# Patient Record
Sex: Male | Born: 1945 | Race: White | Hispanic: No | Marital: Married | Smoking: Never smoker
Health system: Southern US, Community
[De-identification: ages and names within clinical notes are randomized; demographics above are authoritative.]

## PROBLEM LIST (undated history)

## (undated) HISTORY — PX: KNEE SURGERY: SHX244

## (undated) HISTORY — PX: CARDIAC VALVE REPLACEMENT: SHX585

---

## 2019-01-17 ENCOUNTER — Other Ambulatory Visit: Payer: Self-pay

## 2019-01-17 ENCOUNTER — Emergency Department (HOSPITAL_BASED_OUTPATIENT_CLINIC_OR_DEPARTMENT_OTHER)
Admission: EM | Admit: 2019-01-17 | Discharge: 2019-01-17 | Disposition: A | Discharge status: Home / Self Care | Payer: Medicare HMO | Attending: Emergency Medicine | Admitting: Emergency Medicine

## 2019-01-17 ENCOUNTER — Encounter (HOSPITAL_BASED_OUTPATIENT_CLINIC_OR_DEPARTMENT_OTHER): Payer: Self-pay

## 2019-01-17 ENCOUNTER — Emergency Department (HOSPITAL_BASED_OUTPATIENT_CLINIC_OR_DEPARTMENT_OTHER): Payer: Medicare HMO

## 2019-01-17 DIAGNOSIS — W228XXA Striking against or struck by other objects, initial encounter: Secondary | ICD-10-CM | POA: Insufficient documentation

## 2019-01-17 DIAGNOSIS — Y998 Other external cause status: Secondary | ICD-10-CM | POA: Insufficient documentation

## 2019-01-17 DIAGNOSIS — S0990XA Unspecified injury of head, initial encounter: Secondary | ICD-10-CM | POA: Diagnosis present

## 2019-01-17 DIAGNOSIS — S0101XA Laceration without foreign body of scalp, initial encounter: Secondary | ICD-10-CM

## 2019-01-17 DIAGNOSIS — Z23 Encounter for immunization: Secondary | ICD-10-CM | POA: Insufficient documentation

## 2019-01-17 DIAGNOSIS — Y9389 Activity, other specified: Secondary | ICD-10-CM | POA: Diagnosis not present

## 2019-01-17 DIAGNOSIS — Y9289 Other specified places as the place of occurrence of the external cause: Secondary | ICD-10-CM | POA: Diagnosis not present

## 2019-01-17 DIAGNOSIS — S0181XA Laceration without foreign body of other part of head, initial encounter: Secondary | ICD-10-CM | POA: Diagnosis not present

## 2019-01-17 MED ORDER — TETANUS-DIPHTH-ACELL PERTUSSIS 5-2.5-18.5 LF-MCG/0.5 IM SUSP
0.5000 mL | Freq: Once | INTRAMUSCULAR | Status: AC
  Administered 2019-01-17: 0.5 mL via INTRAMUSCULAR
  Filled 2019-01-17: qty 0.5

## 2019-01-17 MED ORDER — TETANUS-DIPHTHERIA TOXOIDS TD 5-2 LFU IM INJ
0.5000 mL | INJECTION | Freq: Once | INTRAMUSCULAR | Status: DC
  Filled 2019-01-17: qty 0.5

## 2019-01-17 MED ORDER — LIDOCAINE-EPINEPHRINE 2 %-1:100000 IJ SOLN
20.0000 mL | Freq: Once | INTRAMUSCULAR | Status: DC
  Filled 2019-01-17: qty 20

## 2019-01-17 MED ORDER — LIDOCAINE-EPINEPHRINE (PF) 2 %-1:200000 IJ SOLN
INTRAMUSCULAR | Status: AC
  Administered 2019-01-17: 10 mL
  Filled 2019-01-17: qty 20
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
## 2019-01-17 NOTE — ED Notes (Signed)
Provider at bedside for suture repair.
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
## 2019-01-17 NOTE — Discharge Instructions (Addendum)
WOUND CARE Please have your stitches/staples removed in 4-5 days or sooner if you have concerns. You may do this at any available urgent care or at your primary care doctor's office.  Keep area clean and dry for 24 hours. Do not remove bandage, if applied.  After 24 hours, remove bandage and wash wound gently with mild soap and warm water. Reapply a new bandage after cleaning wound, if directed.  Continue daily cleansing with soap and water until stitches/staples are removed.  Do not apply any ointments or creams to the wound while stitches/staples are in place, as this may cause delayed healing.  Seek medical careif you experience any of the following signs of infection: Swelling, redness, pus drainage, streaking, fever >101.0 F  Seek care if you experience excessive bleeding that does not stop after 15-20 minutes of constant, firm pressure.

## 2019-01-17 NOTE — ED Notes (Signed)
Pt verbalizes understanding of d/c instructions and denies any further needs at this time. 
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
